# Patient Record
Sex: Male | Born: 1985 | Race: White | Hispanic: No | Marital: Single | Smoking: Former smoker
Health system: Southern US, Community
[De-identification: ages and names within clinical notes are randomized; demographics above are authoritative.]

## PROBLEM LIST (undated history)

## (undated) DIAGNOSIS — Z8659 Personal history of other mental and behavioral disorders: Secondary | ICD-10-CM

## (undated) HISTORY — PX: EYE SURGERY: SHX253

---
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
## 2007-12-11 ENCOUNTER — Emergency Department (HOSPITAL_COMMUNITY): Admission: EM | Admit: 2007-12-11 | Discharge: 2007-12-11 | Payer: Self-pay | Admitting: Emergency Medicine

## 2009-07-05 ENCOUNTER — Encounter (INDEPENDENT_AMBULATORY_CARE_PROVIDER_SITE_OTHER): Payer: Self-pay | Admitting: *Deleted

## 2012-02-26 ENCOUNTER — Ambulatory Visit (INDEPENDENT_AMBULATORY_CARE_PROVIDER_SITE_OTHER): Payer: 59 | Admitting: Physician Assistant

## 2012-02-26 VITALS — BP 124/76 | HR 94 | Temp 98.4°F | Resp 18 | Ht 68.0 in | Wt 142.0 lb

## 2012-02-26 DIAGNOSIS — L723 Sebaceous cyst: Secondary | ICD-10-CM

## 2012-02-26 NOTE — Progress Notes (Signed)
  Subjective:    Patient ID: Timothy Whitaker, male    DOB: 13-Apr-1986, 26 y.o.   MRN: [id number]  HPI 26 year old male presents with 6 year history of bumps on scrotum. He has been seen here multiple times in the past for evaluation, and has also been referred to dermatology. He never went to see Derm and never took any of the medications that were prescribed by [place] (doxycycline).  Says these lesions first appeared in high school, "years" before he became sexually active.  Denies any pain or tenderness, penile discharge, dysuria, fevers, or chills. No concern about STI's today. He wants a referral to Dermatology for further evaluation and treatment.     Review of Systems  All other systems reviewed and are negative.       Objective:   Physical Exam  Constitutional: He is oriented to person, place, and time. He appears well-developed and well-nourished.  HENT:  Head: Normocephalic and atraumatic.  Right Ear: External ear normal.  Left Ear: External ear normal.  Eyes: Conjunctivae are normal.  Neck: Normal range of motion.  Cardiovascular: Normal rate, regular rhythm and normal heart sounds.   Pulmonary/Chest: Effort normal and breath sounds normal.  Genitourinary: Testes normal and penis normal.       Scrotum has multiple sebaceous cysts of varying size. Several have open pore with white material that can be expressed.   Lymphadenopathy:       Right: No inguinal adenopathy present.       Left: No inguinal adenopathy present.  Neurological: He is alert and oriented to person, place, and time.  Psychiatric: He has a normal mood and affect. His behavior is normal. Judgment and thought content normal.          Assessment & Plan:   1. Sebaceous cyst  Multiple sebaceous cysts, non inflamed today.  Will refer to Dermatology for further evaluation and treatment.  Ambulatory referral to Dermatology

## 2013-09-13 ENCOUNTER — Ambulatory Visit: Payer: 59 | Admitting: Emergency Medicine

## 2013-09-13 VITALS — BP 122/68 | HR 99 | Temp 97.8°F | Resp 16 | Ht 68.5 in | Wt 136.0 lb

## 2013-09-13 DIAGNOSIS — J029 Acute pharyngitis, unspecified: Secondary | ICD-10-CM

## 2013-09-13 LAB — POCT RAPID STREP A (OFFICE): Rapid Strep A Screen: NEGATIVE

## 2013-09-13 MED ORDER — FIRST-DUKES MOUTHWASH MT SUSP: OROMUCOSAL | Status: DC

## 2013-09-13 NOTE — Progress Notes (Addendum)
   Subjective:    Patient ID: Timothy Whitaker, male    DOB: 02/11/1986, 28 y.o.   MRN: [id number]  HPI This chart was scribed for Lesle ChrisSteven Daub, MD by Andrew Auaven Small, ED Scribe. This patient was seen in room 5 and the patient's care was started at 10:07 AM.  HPI Comments: Timothy Whitaker is a 28 y.o. male who presents to the [facility] complaining of a constant sore throat onset 3-4 days. He reports it hurts to swallow and that his symptoms are not getting better. He reports a post nasal drip. Pt reports that has been having difficulty sleeping due to sore throat. He denies being around people with strep throat. 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 He denies Ha, fever, and cough. He denies feeling bad. Pt denies any other medical problem. Pt states that he has strep throat in the past but does not feel as bad.   History reviewed. No pertinent past medical history. No Known Allergies Prior to Admission medications   Not on File   Review of Systems  Constitutional: Negative for fever and chills.  HENT: Positive for postnasal drip, sore throat and trouble swallowing. Negative for congestion and sinus pressure.   Neurological: Negative for headaches.      Objective:   Physical Exam  Nursing note and vitals reviewed. Constitutional: He is oriented to person, place, and time. He appears well-developed and well-nourished. No distress.  HENT:  Head: Normocephalic and atraumatic.  Eyes: Conjunctivae and EOM are normal.  Neck: Neck supple. No tracheal deviation present.  Cardiovascular: Normal rate.   Pulmonary/Chest: Effort normal. No respiratory distress.  Musculoskeletal: Normal range of motion.  Neurological: He is alert and oriented to person, place, and time.  Skin: Skin is warm and dry.  Psychiatric: He has a normal mood and affect. His behavior is normal.   tonsils are 2+. There is mild exudate on the superior left tonsil. There is minimal  cervical adenopathy Results for orders placed in visit on 09/13/13  POCT RAPID STREP A (OFFICE)      Result Value Range   Rapid Strep A Screen Negative  Negative    Filed Vitals:   09/13/13 0927  BP: 122/68  Pulse: 99  Temp: 97.8 F (36.6 C)  Resp: 16      Assessment & Plan:  We'll treat symptomatically with saltwater gargle and Dukes mouthwash I personally performed the services described in this documentation, which was scribed in my presence. The recorded information has been reviewed and is accurate.

## 2013-09-13 NOTE — Patient Instructions (Signed)
Sore Throat A sore throat is pain, burning, irritation, or scratchiness of the throat. There is often pain or tenderness when swallowing or talking. A sore throat may be accompanied by other symptoms, such as coughing, sneezing, fever, and swollen neck glands. A sore throat is often the first sign of another sickness, such as a cold, flu, strep throat, or mononucleosis (commonly known as mono). Most sore throats go away without medical treatment. CAUSES  The most common causes of a sore throat include:  A viral infection, such as a cold, flu, or mono.  A bacterial infection, such as strep throat, tonsillitis, or whooping cough.  Seasonal allergies.  Dryness in the air.  Irritants, such as smoke or pollution.  Gastroesophageal reflux disease (GERD). HOME CARE INSTRUCTIONS   Only take over-the-counter medicines as directed by your caregiver.  Drink enough fluids to keep your urine clear or pale yellow.  Rest as needed.  Try using throat sprays, lozenges, or sucking on hard candy to ease any pain (if older than 4 years or as directed).  Sip warm liquids, such as broth, herbal tea, or warm water with honey to relieve pain temporarily. You may also eat or drink cold or frozen liquids such as frozen ice pops.  Gargle with salt water (mix 1 tsp salt with 8 oz of water).  Do not smoke and avoid secondhand smoke.  Put a cool-mist humidifier in your bedroom at night to moisten the air. You can also turn on a hot shower and sit in the bathroom with the door closed for 5 10 minutes. SEEK IMMEDIATE MEDICAL CARE IF:  You have difficulty breathing.  You are unable to swallow fluids, soft foods, or your saliva.  You have increased swelling in the throat.  Your sore throat does not get better in 7 days.  You have nausea and vomiting.  You have a fever or persistent symptoms for more than 2 3 days.  You have a fever and your symptoms suddenly get worse. MAKE SURE YOU:   Understand  these instructions.  Will watch your condition.  Will get help right away if you are not doing well or get worse. Document Released: 09/06/2004 Document Revised: 07/16/2012 Document Reviewed: 04/06/2012 [employer] Patient Information 2014 [employer].  

## 2013-09-16 ENCOUNTER — Telehealth: Payer: Self-pay

## 2013-09-16 LAB — CULTURE, GROUP A STREP

## 2013-09-16 MED ORDER — AMOXICILLIN 875 MG PO TABS: 875.0000 mg | ORAL_TABLET | Freq: Two times a day (BID) | ORAL | Status: DC

## 2013-09-16 NOTE — Telephone Encounter (Signed)
Called pt per Dr Ellis Parentsaub's req and advised culture pos for Strep not group A and sent in Abx. Pt reported that his sore throat has improved and hasn't really noticed it hurting today. Advised pt to not p/up Rx before hearing back from us unless he worsens again. Dr Cleta Albertsaub, do you want pt to take Abx since Sxs have resolved?

## 2013-09-17 NOTE — Telephone Encounter (Signed)
No anw/No VM set up. Will try again later.

## 2013-09-17 NOTE — Telephone Encounter (Signed)
Called patient and let him know that it is not essential that he take the antibiotics because it is not group a strep. If he is fine now and has no sore throat or fever he does not have to take the antibiotic.

## 2013-09-20 NOTE — Telephone Encounter (Signed)
Unable to reach . VM not set up

## 2013-09-21 ENCOUNTER — Encounter: Payer: Self-pay | Admitting: Radiology

## 2013-09-21 NOTE — Telephone Encounter (Signed)
UTR letter sent. 

## 2013-11-01 ENCOUNTER — Ambulatory Visit: Payer: 59 | Admitting: Family Medicine

## 2013-11-01 VITALS — BP 120/80 | HR 115 | Temp 97.3°F | Resp 20 | Ht 68.0 in | Wt 138.0 lb

## 2013-11-01 DIAGNOSIS — R112 Nausea with vomiting, unspecified: Secondary | ICD-10-CM

## 2013-11-01 LAB — POCT CBC
Granulocyte percent: 83.9 %G — AB (ref 37–80)
HCT, POC: 50.8 % (ref 43.5–53.7)
Hemoglobin: 16.4 g/dL (ref 14.1–18.1)
Lymph, poc: 1.6 (ref 0.6–3.4)
MCH, POC: 30.8 pg (ref 27–31.2)
MCHC: 32.3 g/dL (ref 31.8–35.4)
MCV: 95.3 fL (ref 80–97)
MID (cbc): 0.5 (ref 0–0.9)
MPV: 11.4 fL (ref 0–99.8)
POC Granulocyte: 10.7 — AB (ref 2–6.9)
POC LYMPH PERCENT: 12.5 %L (ref 10–50)
POC MID %: 3.6 %M (ref 0–12)
Platelet Count, POC: 254 10*3/uL (ref 142–424)
RBC: 5.33 M/uL (ref 4.69–6.13)
RDW, POC: 14.7 %
WBC: 12.8 10*3/uL — AB (ref 4.6–10.2)

## 2013-11-01 MED ORDER — ONDANSETRON 8 MG PO TBDP: 8.0000 mg | ORAL_TABLET | Freq: Three times a day (TID) | ORAL | Status: DC | PRN

## 2013-11-01 MED ORDER — ONDANSETRON HCL 4 MG/2ML IJ SOLN
8.0000 mg | Freq: Once | INTRAMUSCULAR | Status: AC
  Administered 2013-11-01: 8 mg via INTRAVENOUS

## 2013-11-01 NOTE — Patient Instructions (Signed)

## 2013-11-01 NOTE — Progress Notes (Signed)
This chart was scribed for Elvina SidleKurt Lauenstein, MD by Quintella ReichertMatthew Underwood, ED scribe.  This patient [id number] and the patient's care was started at 10:36 AM.  @[facility] @  Patient ID: Timothy Whitaker MRN: [id number], DOB: [birth year], 28 y.o. Date of Encounter: 11/01/2013, 10:36 AM  Primary Physician: No primary provider on file.  Chief Complaint  Patient presents with   Nausea    4 am   Emesis     HPI: 28 y.o. year old male with history below presents with vomiting that began on waking this morning.  Pt states he was slightly nauseated last night and did not eat dinner.  This morning he awoke very nauseated at 4:30 AM and he has been vomiting continuously since then.  He also reports abdominal soreness and dizziness.  He denies diarrhea, fever, sore throat, cough, congestion, headache, or any other associated symptoms.    Pt denies prior h/o GI issues.  He denies recent sick contacts or suspect food intake.  He drank some alcohol last night but does not think he drank enough to cause his symptoms.  He denies h/o asthma or any other medical conditions.  He works in [employer] but has not been working for the past few days.   No past medical history on file.   Home Meds: Prior to Admission medications   Medication Sig Start Date End Date Taking? Authorizing Provider  amoxicillin (AMOXIL) 875 MG tablet Take 1 tablet (875 mg total) by mouth 2 (two) times daily. 09/16/13  Yes Collene GobbleSteven A Daub, MD  Diphenhyd-Hydrocort-Nystatin (FIRST-DUKES MOUTHWASH) SUSP 1 teaspoon as rinse, gargle, and spit 4 times a day 09/13/13  Yes Collene GobbleSteven A Daub, MD    Allergies: No Known Allergies  History   Social History   Marital Status: Single    Spouse Name: N/A    Number of Children: N/A   Years of Education: N/A   Occupational History   Not on file.   Social History Main Topics   Smoking status: Former Smoker   Smokeless tobacco: Not on file   Alcohol Use: Not on file   Drug Use: Not on  file   Sexual Activity: Not on file   Other Topics Concern   Not on file   Social History Narrative   No narrative on file     Review of Systems: Constitutional: negative for chills, fever, night sweats, weight changes, or fatigue  HEENT: negative for vision changes, hearing loss, congestion, rhinorrhea, ST, epistaxis, or sinus pressure Cardiovascular: negative for chest pain or palpitations Respiratory: negative for hemoptysis, wheezing, shortness of breath, or cough Abdominal: positive for abdominal pain, nausea, and vomiting. negative for diarrhea or constipation Dermatological: negative for rash Neurologic: positive for dizziness. negative for headache or syncope All other systems reviewed and are otherwise negative with the exception to those above and in the HPI.   Physical Exam: Blood pressure 120/80, pulse 115, temperature 97.3 F (36.3 C), temperature source Oral, resp. rate 20, height 5\' 8"  (1.727 m), weight 138 lb (62.596 kg), SpO2 100.00%., Body mass index is 20.99 kg/(m^2). General: Well developed, well nourished, in no acute distress. Head: Normocephalic, atraumatic, eyes without discharge, sclera non-icteric, nares are without discharge. Bilateral auditory canals clear, TM's are without perforation, pearly grey and translucent with reflective cone of light bilaterally. Oral cavity moist, posterior pharynx without exudate, erythema, peritonsillar abscess, or post nasal drip.  Neck: Supple. No thyromegaly. Full ROM. No lymphadenopathy. Lungs: Clear bilaterally to auscultation without  wheezes, rales, or rhonchi. Breathing is unlabored. Heart: RRR with S1 S2. No murmurs, rubs, or gallops appreciated. Abdomen: Soft, non-tender, non-distended with normoactive bowel sounds. No hepatomegaly. No rebound/guarding. No obvious abdominal masses.  No HSM. Msk:  Strength and tone normal for age. Extremities/Skin: Warm and dry. No clubbing or cyanosis. No edema. No rashes or  suspicious lesions. Neuro: Alert and oriented X 3. Moves all extremities spontaneously. Gait is normal. CNII-XII grossly in tact. Psych:  Responds to questions appropriately with a normal affect.   Labs:  Results for orders placed in visit on 09/13/13  CULTURE, GROUP A STREP      Result Value Ref Range   Organism ID, Bacteria STREPTOCOCCUS BETA HEMOLYTIC NOT GROUP A    POCT RAPID STREP A (OFFICE)      Result Value Ref Range   Rapid Strep A Screen Negative  Negative   Results for orders placed in visit on 11/01/13  POCT CBC      Result Value Ref Range   WBC 12.8 (*) 4.6 - 10.2 K/uL   Lymph, poc 1.6  0.6 - 3.4   POC LYMPH PERCENT 12.5  10 - 50 %L   MID (cbc) 0.5  0 - 0.9   POC MID % 3.6  0 - 12 %M   POC Granulocyte 10.7 (*) 2 - 6.9   Granulocyte percent 83.9 (*) 37 - 80 %G   RBC 5.33  4.69 - 6.13 M/uL   Hemoglobin 16.4  14.1 - 18.1 g/dL   HCT, POC 16.1  09.6 - 53.7 %   MCV 95.3  80 - 97 fL   MCH, POC 30.8  27 - 31.2 pg   MCHC 32.3  31.8 - 35.4 g/dL   RDW, POC 04.5     Platelet Count, POC 254  142 - 424 K/uL   MPV 11.4  0 - 99.8 fL     ASSESSMENT AND PLAN:  28 y.o. year old male with acute nausea, vomiting and dehydration  Nausea and vomiting - Plan: ondansetron (ZOFRAN) injection 8 mg, POCT CBC, ondansetron (ZOFRAN ODT) 8 MG disintegrating tablet    Signed, Elvina Sidle, MD 11/01/2013 10:36 AM

## 2014-01-13 ENCOUNTER — Ambulatory Visit: Payer: Self-pay

## 2014-01-13 ENCOUNTER — Ambulatory Visit (INDEPENDENT_AMBULATORY_CARE_PROVIDER_SITE_OTHER): Payer: 59 | Admitting: Family Medicine

## 2014-01-13 ENCOUNTER — Ambulatory Visit: Payer: 59

## 2014-01-13 DIAGNOSIS — S0990XA Unspecified injury of head, initial encounter: Secondary | ICD-10-CM

## 2014-01-13 DIAGNOSIS — S42009A Fracture of unspecified part of unspecified clavicle, initial encounter for closed fracture: Secondary | ICD-10-CM

## 2014-01-13 MED ORDER — KETOROLAC TROMETHAMINE 60 MG/2ML IM SOLN
60.0000 mg | Freq: Once | INTRAMUSCULAR | Status: AC
  Administered 2014-01-13: 60 mg via INTRAMUSCULAR
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
## 2014-01-13 NOTE — Patient Instructions (Signed)
Thank you for coming in today  You broke your collarbone Stay in sling until followup We will refer you to orthopedics for evaluation of clavicle fracture Please return tomorrow for re-evaluation  For pain tonight, Take either 800 mg ibuprofen (4 tablets) every 8 hrs or Aleve 2 tablets 2x per day Also may take tylenol 1000 mg (2 extra strength tablets) up to 3 x per day When you come back tomorrow we can reassess and consider pain medication once the alcohol has worn off  Clavicle Fracture A clavicle fracture is a break in the collarbone. This is a common injury, especially in children. Collarbones do not harden until around the age of 66. Most collarbone fractures are treated with a simple arm sling. In some cases a figure-of-eight splint is used to help hold the broken bones in position. Although not often needed, surgery may be required if the bone fragments are not in the correct position (displaced).  HOME CARE INSTRUCTIONS   Apply ice to the injury for 15-20 minutes each hour while awake for 2 days. Put the ice in a plastic bag and place a towel between the bag of ice and your skin.  Wear the sling or splint constantly for as long as directed by your caregiver. You may remove the sling or splint for bathing or showering. Be sure to keep your shoulder in the same place as when the sling or splint is on. Do not lift your arm.  If a figure-of-eight splint is applied, it must be tightened by another person every day. Tighten it enough to keep the shoulders held back. Allow enough room to place the index finger between the body and strap. Loosen the splint immediately if you feel numbness or tingling in your hands.  Only take over-the-counter or prescription medicines for pain, discomfort, or fever as directed by your caregiver.  Avoid activities that irritate or increase the pain for 4 to 6 weeks after surgery.  Follow all instructions for follow-up with your caregiver. This includes any  referrals, physical therapy, and rehabilitation. Any delay in obtaining necessary care could result in a delay or failure of the injury to heal properly. SEEK MEDICAL CARE IF:  You have pain and swelling that are not relieved with medications. SEEK IMMEDIATE MEDICAL CARE IF:  Your arm is numb, cold, or pale, even when the splint is loose. MAKE SURE YOU:   Understand these instructions.  Will watch your condition.  Will get help right away if you are not doing well or get worse. Document Released: 05/09/2005 Document Revised: 10/22/2011 Document Reviewed: 03/04/2008 [facility] Patient Information 2014 [place], [place].

## 2014-02-03 ENCOUNTER — Encounter (HOSPITAL_BASED_OUTPATIENT_CLINIC_OR_DEPARTMENT_OTHER): Payer: Self-pay | Admitting: *Deleted

## 2014-02-03 ENCOUNTER — Other Ambulatory Visit: Payer: Self-pay | Admitting: Orthopedic Surgery

## 2014-02-05 ENCOUNTER — Encounter (HOSPITAL_BASED_OUTPATIENT_CLINIC_OR_DEPARTMENT_OTHER)
Admission: RE | Disposition: A | Discharge status: Home / Self Care | Payer: Self-pay | Source: Ambulatory Visit | Attending: Orthopedic Surgery

## 2014-02-05 ENCOUNTER — Encounter (HOSPITAL_BASED_OUTPATIENT_CLINIC_OR_DEPARTMENT_OTHER): Payer: Self-pay

## 2014-02-05 ENCOUNTER — Encounter (HOSPITAL_BASED_OUTPATIENT_CLINIC_OR_DEPARTMENT_OTHER): Payer: Self-pay | Admitting: Anesthesiology

## 2014-02-05 ENCOUNTER — Ambulatory Visit (HOSPITAL_BASED_OUTPATIENT_CLINIC_OR_DEPARTMENT_OTHER)
Admission: RE | Admit: 2014-02-05 | Discharge: 2014-02-05 | Disposition: A | Discharge status: Home / Self Care | Payer: Self-pay | Source: Ambulatory Visit | Attending: Orthopedic Surgery | Admitting: Orthopedic Surgery

## 2014-02-05 ENCOUNTER — Ambulatory Visit (HOSPITAL_BASED_OUTPATIENT_CLINIC_OR_DEPARTMENT_OTHER): Payer: Self-pay | Admitting: Anesthesiology

## 2014-02-05 DIAGNOSIS — S42023A Displaced fracture of shaft of unspecified clavicle, initial encounter for closed fracture: Secondary | ICD-10-CM | POA: Insufficient documentation

## 2014-02-05 DIAGNOSIS — F909 Attention-deficit hyperactivity disorder, unspecified type: Secondary | ICD-10-CM | POA: Insufficient documentation

## 2014-02-05 DIAGNOSIS — Z87891 Personal history of nicotine dependence: Secondary | ICD-10-CM | POA: Insufficient documentation

## 2014-02-05 DIAGNOSIS — X58XXXA Exposure to other specified factors, initial encounter: Secondary | ICD-10-CM | POA: Insufficient documentation

## 2014-02-05 HISTORY — DX: Personal history of other mental and behavioral disorders: Z86.59

## 2014-02-05 HISTORY — PX: ORIF CLAVICULAR FRACTURE: SHX5055

## 2014-02-05 SURGERY — OPEN REDUCTION INTERNAL FIXATION (ORIF) CLAVICULAR FRACTURE
Anesthesia: General | Laterality: Left

## 2014-02-05 MED ORDER — ONDANSETRON HCL 4 MG/2ML IJ SOLN: 4.0000 mg | Freq: Four times a day (QID) | INTRAMUSCULAR | Status: DC | PRN

## 2014-02-05 MED ORDER — OXYCODONE HCL 5 MG PO TABS
ORAL_TABLET | ORAL | Status: AC
  Filled 2014-02-05: qty 1

## 2014-02-05 MED ORDER — CEFAZOLIN SODIUM 1-5 GM-% IV SOLN
INTRAVENOUS | Status: AC
  Filled 2014-02-05: qty 50

## 2014-02-05 MED ORDER — CEFAZOLIN SODIUM-DEXTROSE 2-3 GM-% IV SOLR
INTRAVENOUS | Status: AC
  Filled 2014-02-05: qty 50

## 2014-02-05 MED ORDER — KETOROLAC TROMETHAMINE 30 MG/ML IJ SOLN
INTRAMUSCULAR | Status: DC | PRN
  Administered 2014-02-05: 30 mg via INTRAVENOUS

## 2014-02-05 MED ORDER — SUCCINYLCHOLINE CHLORIDE 20 MG/ML IJ SOLN
INTRAMUSCULAR | Status: DC | PRN
  Administered 2014-02-05: 100 mg via INTRAVENOUS

## 2014-02-05 MED ORDER — MIDAZOLAM HCL 2 MG/2ML IJ SOLN
INTRAMUSCULAR | Status: AC
  Filled 2014-02-05: qty 2

## 2014-02-05 MED ORDER — HYDROMORPHONE HCL 2 MG PO TABS: 2.0000 mg | ORAL_TABLET | ORAL | Status: AC | PRN

## 2014-02-05 MED ORDER — ONDANSETRON HCL 4 MG/2ML IJ SOLN
INTRAMUSCULAR | Status: DC | PRN
  Administered 2014-02-05: 4 mg via INTRAVENOUS

## 2014-02-05 MED ORDER — CEFAZOLIN SODIUM-DEXTROSE 2-3 GM-% IV SOLR
2.0000 g | INTRAVENOUS | Status: AC
  Administered 2014-02-05: 2 g via INTRAVENOUS

## 2014-02-05 MED ORDER — SODIUM CHLORIDE 0.9 % IJ SOLN
INTRAMUSCULAR | Status: AC
  Filled 2014-02-05: qty 10

## 2014-02-05 MED ORDER — LACTATED RINGERS IV SOLN
INTRAVENOUS | Status: DC
  Administered 2014-02-05 (×2): via INTRAVENOUS

## 2014-02-05 MED ORDER — LIDOCAINE HCL (CARDIAC) 20 MG/ML IV SOLN
INTRAVENOUS | Status: DC | PRN
  Administered 2014-02-05: 75 mg via INTRAVENOUS

## 2014-02-05 MED ORDER — FENTANYL CITRATE 0.05 MG/ML IJ SOLN
INTRAMUSCULAR | Status: DC | PRN
  Administered 2014-02-05 (×3): 100 ug via INTRAVENOUS

## 2014-02-05 MED ORDER — PROPOFOL 10 MG/ML IV BOLUS
INTRAVENOUS | Status: DC | PRN
  Administered 2014-02-05: 200 mg via INTRAVENOUS

## 2014-02-05 MED ORDER — MIDAZOLAM HCL 5 MG/5ML IJ SOLN
INTRAMUSCULAR | Status: DC | PRN
  Administered 2014-02-05: 4 mg via INTRAVENOUS

## 2014-02-05 MED ORDER — FENTANYL CITRATE 0.05 MG/ML IJ SOLN
INTRAMUSCULAR | Status: AC
  Filled 2014-02-05: qty 6

## 2014-02-05 MED ORDER — PROMETHAZINE HCL 25 MG/ML IJ SOLN
INTRAMUSCULAR | Status: AC
  Filled 2014-02-05: qty 1

## 2014-02-05 MED ORDER — BUPIVACAINE HCL (PF) 0.5 % IJ SOLN
INTRAMUSCULAR | Status: DC | PRN
  Administered 2014-02-05: 20 mL

## 2014-02-05 MED ORDER — HYDROMORPHONE HCL PF 1 MG/ML IJ SOLN
INTRAMUSCULAR | Status: AC
  Filled 2014-02-05: qty 1

## 2014-02-05 MED ORDER — OXYCODONE HCL 5 MG/5ML PO SOLN: 5.0000 mg | Freq: Once | ORAL | Status: AC | PRN

## 2014-02-05 MED ORDER — OXYCODONE HCL 5 MG PO TABS
5.0000 mg | ORAL_TABLET | Freq: Once | ORAL | Status: AC | PRN
  Administered 2014-02-05: 5 mg via ORAL

## 2014-02-05 MED ORDER — FENTANYL CITRATE 0.05 MG/ML IJ SOLN: 50.0000 ug | INTRAMUSCULAR | Status: DC | PRN

## 2014-02-05 MED ORDER — PROMETHAZINE HCL 25 MG/ML IJ SOLN
6.2500 mg | INTRAMUSCULAR | Status: DC | PRN
  Administered 2014-02-05: 6.25 mg via INTRAVENOUS

## 2014-02-05 MED ORDER — OXYCODONE-ACETAMINOPHEN 5-325 MG PO TABS: 1.0000 | ORAL_TABLET | Freq: Four times a day (QID) | ORAL | Status: DC | PRN

## 2014-02-05 MED ORDER — HYDROMORPHONE HCL PF 1 MG/ML IJ SOLN
0.2500 mg | INTRAMUSCULAR | Status: DC | PRN
  Administered 2014-02-05 (×2): 0.5 mg via INTRAVENOUS

## 2014-02-05 MED ORDER — DEXAMETHASONE SODIUM PHOSPHATE 4 MG/ML IJ SOLN
INTRAMUSCULAR | Status: DC | PRN
  Administered 2014-02-05: 10 mg via INTRAVENOUS

## 2014-02-05 MED ORDER — CEFAZOLIN SODIUM 1-5 GM-% IV SOLN
1.0000 g | Freq: Once | INTRAVENOUS | Status: AC
  Administered 2014-02-05: 1 g via INTRAVENOUS

## 2014-02-05 MED ORDER — OXYCODONE-ACETAMINOPHEN 5-325 MG PO TABS: 1.0000 | ORAL_TABLET | Freq: Four times a day (QID) | ORAL | Status: AC | PRN

## 2014-02-05 MED ORDER — MIDAZOLAM HCL 2 MG/2ML IJ SOLN: 1.0000 mg | INTRAMUSCULAR | Status: DC | PRN

## 2014-02-05 MED ORDER — MORPHINE SULFATE 10 MG/ML IJ SOLN
INTRAMUSCULAR | Status: AC
  Filled 2014-02-05: qty 1

## 2014-02-05 MED ORDER — POVIDONE-IODINE 7.5 % EX SOLN: Freq: Once | CUTANEOUS | Status: DC

## 2014-02-05 MED ORDER — LACTATED RINGERS IV SOLN
INTRAVENOUS | Status: DC
  Administered 2014-02-05: 16:00:00 via INTRAVENOUS

## 2014-02-05 MED ORDER — MORPHINE SULFATE 10 MG/ML IJ SOLN
INTRAMUSCULAR | Status: DC | PRN
  Administered 2014-02-05 (×2): 5 mg via INTRAVENOUS

## 2014-02-05 SURGICAL SUPPLY — 60 items
BENZOIN TINCTURE PRP APPL 2/3 (GAUZE/BANDAGES/DRESSINGS) ×3 IMPLANT
BIT DRILL 2.8X5 QR DISP (BIT) ×3 IMPLANT
BLADE SURG 15 STRL LF DISP TIS (BLADE) ×2 IMPLANT
BLADE SURG 15 STRL SS (BLADE) ×4
CANISTER SUCT 1200ML W/VALVE (MISCELLANEOUS) ×3 IMPLANT
CLEANER CAUTERY TIP 5X5 PAD (MISCELLANEOUS) ×1 IMPLANT
CLOSURE WOUND 1/2 X4 (GAUZE/BANDAGES/DRESSINGS) ×1
DECANTER SPIKE VIAL GLASS SM (MISCELLANEOUS) IMPLANT
DRAPE C-ARM 42X72 X-RAY (DRAPES) ×3 IMPLANT
DRAPE INCISE IOBAN 66X45 STRL (DRAPES) ×3 IMPLANT
DRAPE OEC MINIVIEW 54X84 (DRAPES) ×3 IMPLANT
DRAPE SURG 17X23 STRL (DRAPES) ×3 IMPLANT
DRAPE U-SHAPE 47X51 STRL (DRAPES) ×3 IMPLANT
DRAPE U-SHAPE 76X120 STRL (DRAPES) ×6 IMPLANT
DRSG EMULSION OIL 3X3 NADH (GAUZE/BANDAGES/DRESSINGS) ×3 IMPLANT
DURAPREP 26ML APPLICATOR (WOUND CARE) ×3 IMPLANT
ELECT REM PT RETURN 9FT ADLT (ELECTROSURGICAL) ×3
ELECTRODE REM PT RTRN 9FT ADLT (ELECTROSURGICAL) ×1 IMPLANT
GAUZE SPONGE 4X4 12PLY STRL (GAUZE/BANDAGES/DRESSINGS) ×3 IMPLANT
GLOVE BIO SURGEON STRL SZ7 (GLOVE) ×3 IMPLANT
GLOVE BIOGEL PI IND STRL 8 (GLOVE) ×2 IMPLANT
GLOVE BIOGEL PI INDICATOR 8 (GLOVE) ×4
GLOVE ECLIPSE 7.5 STRL STRAW (GLOVE) ×6 IMPLANT
GOWN STRL REUS W/ TWL LRG LVL3 (GOWN DISPOSABLE) ×3 IMPLANT
GOWN STRL REUS W/TWL LRG LVL3 (GOWN DISPOSABLE) ×6
GOWN STRL REUS W/TWL XL LVL3 (GOWN DISPOSABLE) ×3 IMPLANT
NS IRRIG 1000ML POUR BTL (IV SOLUTION) ×3 IMPLANT
PACK ARTHROSCOPY DSU (CUSTOM PROCEDURE TRAY) ×3 IMPLANT
PACK BASIN DAY SURGERY FS (CUSTOM PROCEDURE TRAY) ×3 IMPLANT
PAD CLEANER CAUTERY TIP 5X5 (MISCELLANEOUS) ×2
PENCIL BUTTON HOLSTER BLD 10FT (ELECTRODE) ×3 IMPLANT
PLATE CLAVICLE 8 HOLE LEFT (Plate) ×3 IMPLANT
SCREW HEXALOBE LOCKING 3.5X14M (Screw) ×3 IMPLANT
SCREW HEXALOBE NON-LOCK 3.5X14 (Screw) ×3 IMPLANT
SCREW LOCK 12X3.5X HEXALOBE (Screw) ×3 IMPLANT
SCREW LOCKING 3.5X12 (Screw) ×6 IMPLANT
SCREW NON LOCK 3.5X8MM (Screw) ×3 IMPLANT
SCREW NONLOCK HEX 3.5X12 (Screw) ×3 IMPLANT
SLEEVE SCD COMPRESS KNEE MED (MISCELLANEOUS) ×3 IMPLANT
SLING ARM IMMOBILIZER LRG (SOFTGOODS) IMPLANT
SLING ARM IMMOBILIZER MED (SOFTGOODS) IMPLANT
SLING ARM LRG ADULT FOAM STRAP (SOFTGOODS) IMPLANT
SLING ARM MED ADULT FOAM STRAP (SOFTGOODS) ×3 IMPLANT
SLING ARM XL FOAM STRAP (SOFTGOODS) IMPLANT
SPONGE LAP 4X18 X RAY DECT (DISPOSABLE) ×3 IMPLANT
STRIP CLOSURE SKIN 1/2X4 (GAUZE/BANDAGES/DRESSINGS) ×2 IMPLANT
SUCTION FRAZIER TIP 10 FR DISP (SUCTIONS) ×3 IMPLANT
SUT FIBERWIRE #2 38 T-5 BLUE (SUTURE) ×3
SUT MNCRL AB 3-0 PS2 18 (SUTURE) ×3 IMPLANT
SUT VIC AB 0 CT1 27 (SUTURE) ×3
SUT VIC AB 0 CT1 27XBRD ANBCTR (SUTURE) ×1 IMPLANT
SUT VIC AB 1 CT1 27 (SUTURE)
SUT VIC AB 1 CT1 27XBRD ANBCTR (SUTURE) IMPLANT
SUT VIC AB 2-0 SH 27 (SUTURE) ×2
SUT VIC AB 2-0 SH 27XBRD (SUTURE) ×1 IMPLANT
SUTURE FIBERWR #2 38 T-5 BLUE (SUTURE) ×1 IMPLANT
SYR BULB 3OZ (MISCELLANEOUS) ×3 IMPLANT
TOWEL OR 17X24 6PK STRL BLUE (TOWEL DISPOSABLE) ×3 IMPLANT
TOWEL OR NON WOVEN STRL DISP B (DISPOSABLE) ×3 IMPLANT
YANKAUER SUCT BULB TIP NO VENT (SUCTIONS) ×3 IMPLANT

## 2014-02-05 NOTE — Transfer of Care (Signed)
Immediate Anesthesia Transfer of Care Note  Patient: Timothy Whitaker  Procedure(s) Performed: Procedure(s): OPEN REDUCTION INTERNAL FIXATION (ORIF) CLAVICULAR FRACTURE (Left)  Patient Location: PACU  Anesthesia Type:General  Level of Consciousness: awake, alert  and oriented  Airway & Oxygen Therapy: Patient Spontanous Breathing and Patient connected to face mask oxygen  Post-op Assessment: Report given to PACU RN and Post -op Vital signs reviewed and stable  Post vital signs: Reviewed and stable  Complications: No apparent anesthesia complications

## 2014-02-05 NOTE — Brief Op Note (Signed)
02/05/2014  2:16 PM  PATIENT:  Timothy Whitaker  28 y.o. male  PRE-OPERATIVE DIAGNOSIS:  communuted and displaced clavicle fracture Left  POST-OPERATIVE DIAGNOSIS:  communuted and displaced claviclefracture Left  PROCEDURE:  Procedure(s): OPEN REDUCTION INTERNAL FIXATION (ORIF) CLAVICULAR FRACTURE (Left)  SURGEON:  Surgeon(s) and Role:    * Harvie JuniorJohn L Graves, MD - Primary  PHYSICIAN ASSISTANT:   ASSISTANTS: bethune   ANESTHESIA:   general  EBL:  Total I/O In: 1000 [I.V.:1000] Out: -   BLOOD ADMINISTERED:none  DRAINS: none   LOCAL MEDICATIONS USED:  MARCAINE     SPECIMEN:  No Specimen  DISPOSITION OF SPECIMEN:  N/A  COUNTS:  YES  TOURNIQUET:  * No tourniquets in log *  DICTATION: .Other Dictation: Dictation Number [id number]  PLAN OF CARE: Discharge to home after PACU  PATIENT DISPOSITION:  PACU - hemodynamically stable.   Delay start of Pharmacological VTE agent (>24hrs) due to surgical blood loss or risk of bleeding: no

## 2014-02-05 NOTE — H&P (Signed)
PREOPERATIVE H&P  Chief Complaint: l clavicle pain  HPI: Timothy Whitaker is a 28 y.o. male who presents for evaluation of l clavicle pain. It has been present for 1 month and has been worsening. He has failed conservative measures. Pain is rated as moderate.  Past Medical History  Diagnosis Date  . History of ADHD    Past Surgical History  Procedure Laterality Date  . Eye surgery      age 89   History   Social History  . Marital Status: Single    Spouse Name: N/A    Number of Children: N/A  . Years of Education: N/A   Social History Main Topics  . Smoking status: Former Smoker    Types: Cigarettes    Quit date: 01/14/2014  . Smokeless tobacco: None  . Alcohol Use: Yes     Comment: Drinks weekends only  . Drug Use: Yes    Special: Marijuana     Comment: Smoked 1 month ago  . Sexual Activity: No   Other Topics Concern  . None   Social History Narrative  . None   History reviewed. No pertinent family history. No Known Allergies Prior to Admission medications   Medication Sig Start Date End Date Taking? Authorizing Provider  Multiple Vitamin (MULTIVITAMIN) tablet Take 1 tablet by mouth daily.   Yes Historical Provider, MD     Positive ROS: none  All other systems have been reviewed and were otherwise negative with the exception of those mentioned in the HPI and as above.  Physical Exam: Filed Vitals:   02/05/14 1145  BP: 118/76  Pulse: 90  Temp: 98 F (36.7 C)  Resp: 20    General: Alert, no acute distress Cardiovascular: No pedal edema Respiratory: No cyanosis, no use of accessory musculature GI: No organomegaly, abdomen is soft and non-tender Skin: No lesions in the area of chief complaint Neurologic: Sensation intact distally Psychiatric: Patient is competent for consent with normal mood and affect Lymphatic: No axillary or cervical lymphadenopathy  MUSCULOSKELETAL: l clavicle freely mobile fragments  X-RAY: displaced l clacicle fx with sig  comminution Assessment/Plan: communuted and displaced clavicle fracture Left Plan for Procedure(s): OPEN REDUCTION INTERNAL FIXATION (ORIF) CLAVICULAR FRACTURE  The risks benefits and alternatives were discussed with the patient including but not limited to the risks of nonoperative treatment, versus surgical intervention including infection, bleeding, nerve injury, malunion, nonunion, hardware prominence, hardware failure, need for hardware removal, blood clots, cardiopulmonary complications, morbidity, mortality, among others, and they were willing to proceed.  Predicted outcome is good, although there will be at least a six to nine month expected recovery.  Timothy Whitaker L, MD 02/05/2014 11:57 AM

## 2014-02-05 NOTE — Anesthesia Postprocedure Evaluation (Signed)
  Anesthesia Post-op Note  Patient: Timothy Whitaker  Procedure(s) Performed: Procedure(s): OPEN REDUCTION INTERNAL FIXATION (ORIF) CLAVICULAR FRACTURE (Left)  Patient Location: PACU  Anesthesia Type:General  Level of Consciousness: awake and alert   Airway and Oxygen Therapy: Patient Spontanous Breathing  Post-op Pain: mild  Post-op Assessment: Post-op Vital signs reviewed  Post-op Vital Signs: stable  Last Vitals:  Filed Vitals:   02/05/14 1630  BP: 135/77  Pulse: 85  Temp:   Resp: 14    Complications: No apparent anesthesia complications and mild nausea, Rx

## 2014-02-05 NOTE — Anesthesia Preprocedure Evaluation (Signed)
Anesthesia Evaluation  Patient identified by MRN, date of birth, ID band Patient awake    Reviewed: Allergy & Precautions, H&P , NPO status , Patient's Chart, lab work & pertinent test results  Airway Mallampati: II  Neck ROM: full    Dental   Pulmonary former smoker,          Cardiovascular negative cardio ROS      Neuro/Psych ADHD   GI/Hepatic   Endo/Other    Renal/GU      Musculoskeletal   Abdominal   Peds  Hematology   Anesthesia Other Findings   Reproductive/Obstetrics                           Anesthesia Physical Anesthesia Plan  ASA: I  Anesthesia Plan: General   Post-op Pain Management:    Induction: Intravenous  Airway Management Planned: Oral ETT  Additional Equipment:   Intra-op Plan:   Post-operative Plan:   Informed Consent: I have reviewed the patients History and Physical, chart, labs and discussed the procedure including the risks, benefits and alternatives for the proposed anesthesia with the patient or authorized representative who has indicated his/her understanding and acceptance.     Plan Discussed with: CRNA, Anesthesiologist and Surgeon  Anesthesia Plan Comments:         Anesthesia Quick Evaluation

## 2014-02-05 NOTE — Discharge Instructions (Signed)
Wear sling. Apply ice to left shoulder for 2 or 3 days. You may remove the sling for gentle elbow and wrist range of motion Use pain medication as directed. Do not change the dressing. We will do that in the office.   Post Anesthesia Home Care Instructions  Activity: Get plenty of rest for the remainder of the day. A responsible adult should stay with you for 24 hours following the procedure.  For the next 24 hours, DO NOT: -Drive a car -Advertising copywriterperate machinery -Drink alcoholic beverages -Take any medication unless instructed by your physician -Make any legal decisions or sign important papers.  Meals: Start with liquid foods such as gelatin or soup. Progress to regular foods as tolerated. Avoid greasy, spicy, heavy foods. If nausea and/or vomiting occur, drink only clear liquids until the nausea and/or vomiting subsides. Call your physician if vomiting continues.  Special Instructions/Symptoms: Your throat may feel dry or sore from the anesthesia or the breathing tube placed in your throat during surgery. If this causes discomfort, gargle with warm salt water. The discomfort should disappear within 24 hours.

## 2014-02-05 NOTE — Anesthesia Procedure Notes (Signed)
Procedure Name: Intubation Date/Time: 02/05/2014 12:30 PM Performed by: Zenia ResidesPAYNE, Otillia Cordone D Pre-anesthesia Checklist: Patient identified, Emergency Drugs available, Suction available and Patient being monitored Patient Re-evaluated:Patient Re-evaluated prior to inductionOxygen Delivery Method: Circle System Utilized Preoxygenation: Pre-oxygenation with 100% oxygen Intubation Type: IV induction Ventilation: Mask ventilation without difficulty Laryngoscope Size: Mac and 3 Grade View: Grade I Tube type: Oral Tube size: 7.0 mm Number of attempts: 1 Airway Equipment and Method: stylet and oral airway Placement Confirmation: ETT inserted through vocal cords under direct vision,  positive ETCO2 and breath sounds checked- equal and bilateral Secured at: 22 cm Tube secured with: Tape Dental Injury: Teeth and Oropharynx as per pre-operative assessment

## 2014-02-08 ENCOUNTER — Encounter (HOSPITAL_BASED_OUTPATIENT_CLINIC_OR_DEPARTMENT_OTHER): Payer: Self-pay | Admitting: Orthopedic Surgery

## 2014-02-08 NOTE — Op Note (Signed)
NAMLincoln Brigham:  Timothy Whitaker, Timothy Whitaker                  ACCOUNT NO.:  [id number]  MEDICAL RECORD NO.:  [id number]  LOCATION:                               FACILITY:  [facility]  PHYSICIAN:  Harvie JuniorJohn L. Graves, M.D.   DATE OF BIRTH:  1986/04/24  DATE OF PROCEDURE:  02/05/2014 DATE OF DISCHARGE:  02/05/2014                              OPERATIVE REPORT   PREOPERATIVE DIAGNOSIS:  Comminuted closed distal midshaft clavicle fracture.  POSTOPERATIVE DIAGNOSIS:  Comminuted closed distal midshaft clavicle fracture.  PROCEDURE:  Open reduction internal fixation of midshaft comminuted closed clavicle fracture.  SURGEON:  Harvie JuniorJohn L. Graves, M.D.  ASSISTANT:  Marshia LyJames Bethune, P.A.  ANESTHESIA:  General.  BRIEF HISTORY:  Mr. Timothy Whitaker is a 28-year-male, with a __________ suffered a midshaft comminuted clavicle fracture.  We talked to him initially about treatment options after failure of all conservative care, and essentially tenting of the skin and pain with activity at 3 weeks.  He elected to undergo open reduction internal fixation, we talked about continued conservative care, but he elected for open reduction internal fixation.  He was brought to the operating room for this procedure.  DESCRIPTION OF PROCEDURE:  The patient was brought to the operating room.  After adequate anesthesia was obtained with general anesthetic, the patient was placed supine on the operating table.  The left arm and shoulder were prepped and draped in usual sterile fashion.  Following this, incision was made over the clavicle and subcutaneous __________ was dissected down the level of the clavicle.  The clavicle was then identified and the fractured area was identified.  There was significant amount of callus and early callus formation.  This was debrided until we could just get an idea of where the clavicle wanted to go lengthwise and rotation wise.  Once I had that established, I did not really want to continue to take down the healing that  had happened.  At this point, I elected to discontinue taking down the superior surface of the clavicle. Once we can get that, we can get it mobilized enough to get it to the right length and position.  We put a 8-hole clavicle plate on there. Acumed clavicle plate locked it in with 6 cortices on each side, and this fracture zone got a unicortical and then there was large piece out at the front, which we kind a pieced back in and held in place with a lobster jaw clamp and then put #2 FiberWire.  A tension band was essentially around this.  Once this was done, the wound was irrigated, suctioned dry, closed in layers with 0 Vicryl deep and then 2-0 Vicryl and then 3-0 Monocryl subcuticular. Benzoin and Steri-Strips applied sterile compressive dressing was applied.  The patient was taken to the recovery room, he was noted to be in satisfactory condition.  Estimated blood loss for the procedure was minimal.     Harvie JuniorJohn L. Graves, M.D.     Ranae PlumberJLG/MEDQ  D:  02/05/2014  T:  02/06/2014  Job:  [id number]

## 2014-02-08 NOTE — Addendum Note (Signed)
Addendum created 02/08/14 1410 by Lance CoonWesley Webster, CRNA   Modules edited: Anesthesia Responsible Staff

## 2014-06-08 ENCOUNTER — Telehealth: Payer: Self-pay

## 2014-06-08 NOTE — Telephone Encounter (Signed)
Lm for rtn call- pt must see Dr. Merla Richesoolittle at other clinic. We do not have record of him here.

## 2014-06-08 NOTE — Telephone Encounter (Signed)
Pt of Dr. Merla Richesoolittle needing information from Dr. For concealed to carry permit.

## 2015-11-11 IMAGING — CR DG CERVICAL SPINE COMPLETE 4+V
5 series · 5 of 5 positions shown · non-contrast
Comparison: None.

CLINICAL DATA: Status post fall

EXAM:
CERVICAL SPINE  4+ VIEWS

[lpo]
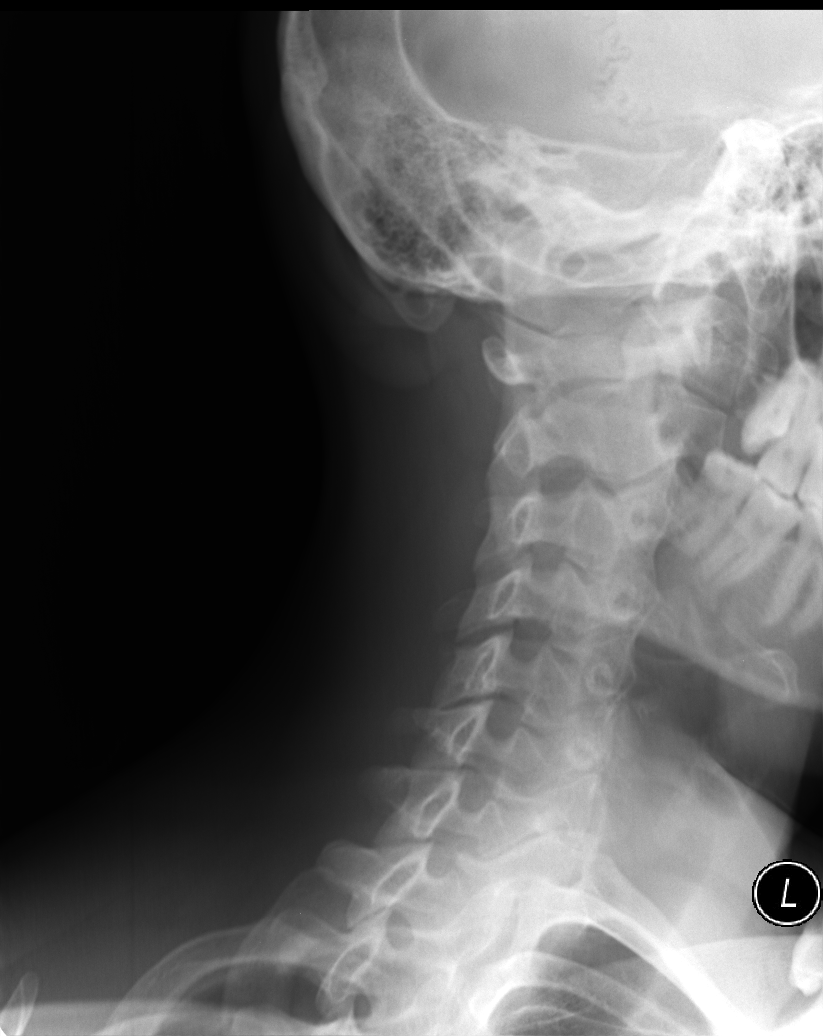

[lateral]
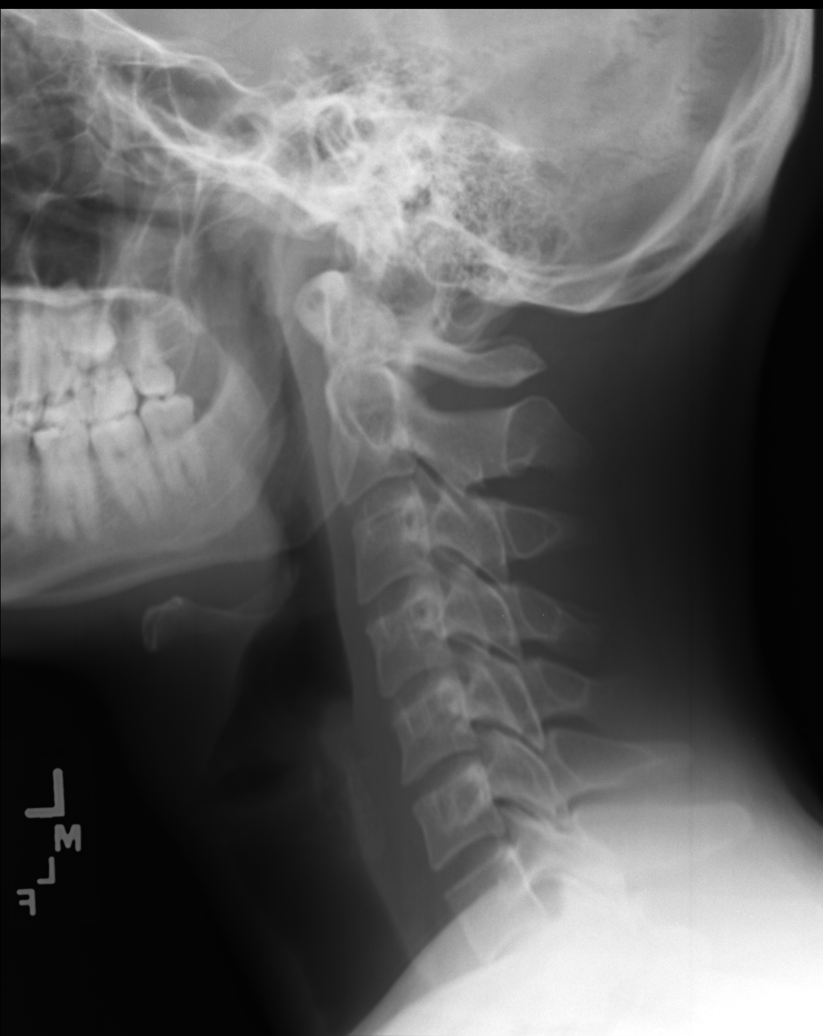

[rpo]
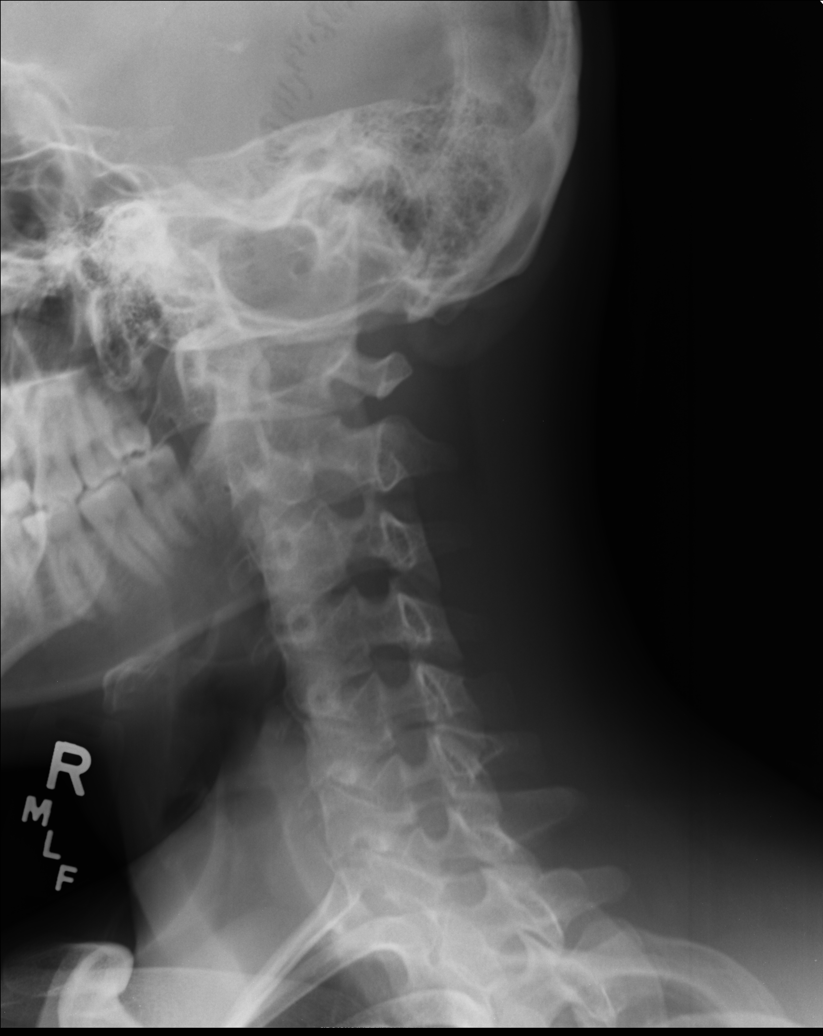

[AP]
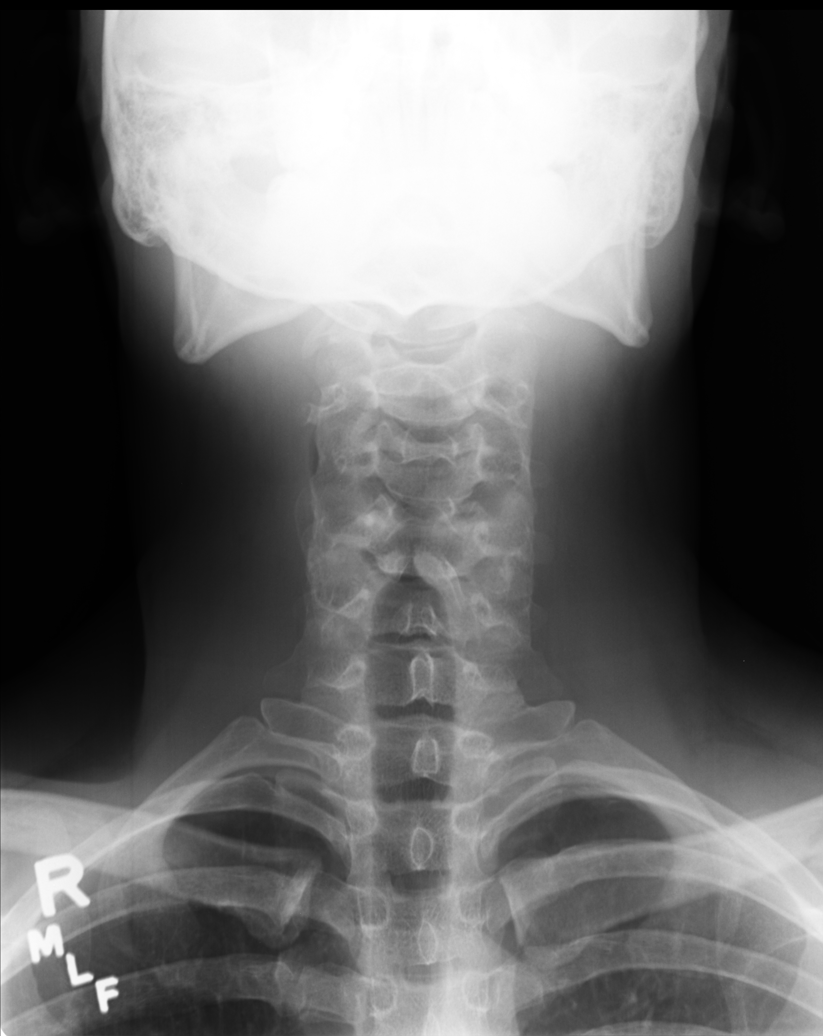

[ap open mouth]
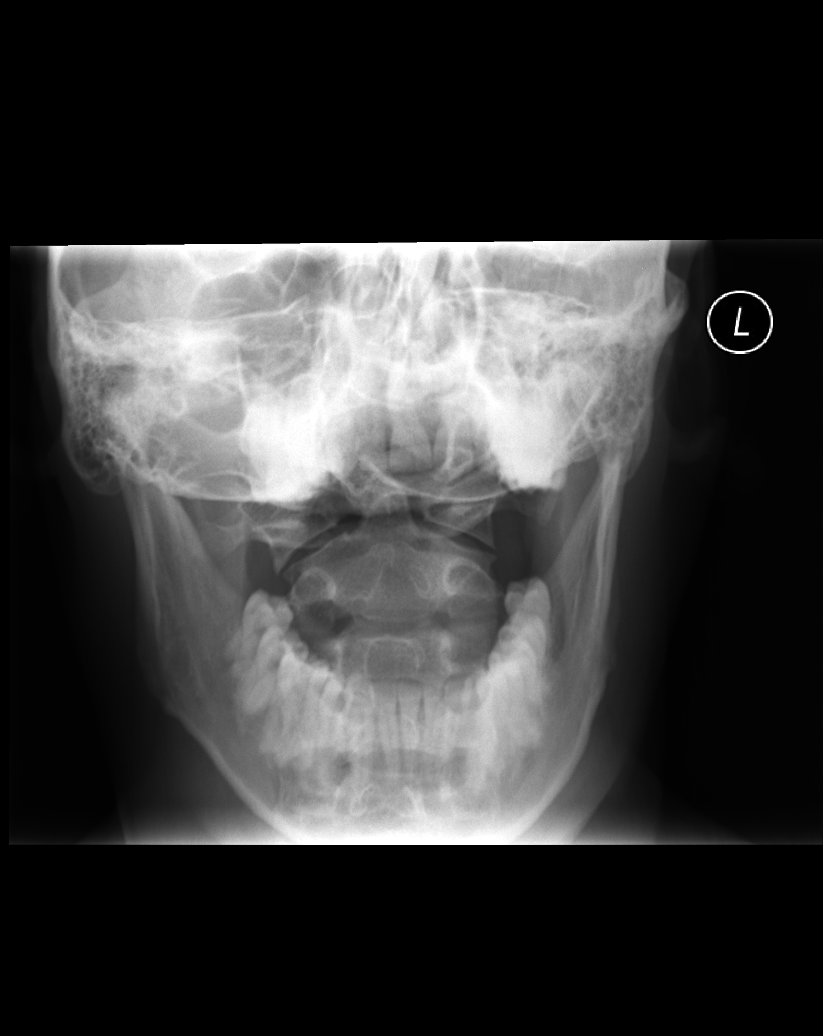

[5 of 5 positions shown; findings below may reference images not displayed]

FINDINGS: The cervical vertebral bodies are preserved in height. The
intervertebral disc space heights and the prevertebral soft tissues
are normal. The oblique views reveal no bony encroachment upon the
neural foramina. The odontoid is intact. The observed portions of
the first and second ribs are normal. A small amount of gas noted on
the frontal film to the right of midline is consistent with the
normal distended hypopharynx.
IMPRESSION: There is no acute cervical spine fracture nor dislocation.
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
## 2021-01-05 ENCOUNTER — Other Ambulatory Visit: Payer: Self-pay

## 2021-01-05 ENCOUNTER — Encounter (HOSPITAL_COMMUNITY): Payer: Self-pay

## 2021-01-05 ENCOUNTER — Emergency Department (HOSPITAL_COMMUNITY)
Admission: EM | Admit: 2021-01-05 | Discharge: 2021-01-05 | Disposition: A | Discharge status: Home / Self Care | Payer: No Typology Code available for payment source | Attending: Emergency Medicine | Admitting: Emergency Medicine

## 2021-01-05 DIAGNOSIS — Y990 Civilian activity done for income or pay: Secondary | ICD-10-CM | POA: Diagnosis not present

## 2021-01-05 DIAGNOSIS — S0990XA Unspecified injury of head, initial encounter: Secondary | ICD-10-CM | POA: Diagnosis present

## 2021-01-05 DIAGNOSIS — S0101XA Laceration without foreign body of scalp, initial encounter: Secondary | ICD-10-CM | POA: Insufficient documentation

## 2021-01-05 DIAGNOSIS — Z23 Encounter for immunization: Secondary | ICD-10-CM | POA: Insufficient documentation

## 2021-01-05 DIAGNOSIS — Z87891 Personal history of nicotine dependence: Secondary | ICD-10-CM | POA: Diagnosis not present

## 2021-01-05 DIAGNOSIS — W228XXA Striking against or struck by other objects, initial encounter: Secondary | ICD-10-CM | POA: Diagnosis not present

## 2021-01-05 MED ORDER — TETANUS-DIPHTH-ACELL PERTUSSIS 5-2.5-18.5 LF-MCG/0.5 IM SUSY
0.5000 mL | PREFILLED_SYRINGE | Freq: Once | INTRAMUSCULAR | Status: AC
  Administered 2021-01-05: 0.5 mL via INTRAMUSCULAR
  Filled 2021-01-05: qty 0.5

## 2021-01-05 MED ORDER — LIDOCAINE HCL (PF) 1 % IJ SOLN
5.0000 mL | Freq: Once | INTRAMUSCULAR | Status: AC
Start: 2021-01-05 — End: 2021-01-05
  Administered 2021-01-05: 5 mL
  Filled 2021-01-05: qty 30

## 2021-01-05 NOTE — ED Notes (Signed)
Pt provided with discharge papers and staff answered all questions. Unable to sign due to placement in hall bed.

## 2021-01-05 NOTE — ED Provider Notes (Signed)
[facility]-EMERGENCY DEPT [facility] Emergency Department Provider Note MRN:  [id number]  Arrival date & time: 01/05/21     Chief Complaint   Head Laceration   History of Present Illness   Timothy Whitaker is a 35 y.o. year-old male with no pertinent past medical history presenting to the ED with chief complaint of head laceration.  Patient injured himself at work.  [place]  He turned around and walked into part of the underside of the airplane.  Struck his head sustaining a laceration.  Did not pass out.  No headache, no nausea or vomiting, no numbness or weakness to the arms or legs.  No neck pain initially but over the past hour has been having some mild soreness to the muscles.  Denies any other injuries.  Pain is mild, constant, no exacerbating or alleviating factors.  Review of Systems  A complete 10 system review of systems was obtained and all systems are negative except as noted in the HPI and PMH.   Patient's Health History    Past Medical History:  Diagnosis Date  . History of ADHD     Past Surgical History:  Procedure Laterality Date  . EYE SURGERY     age 61  . ORIF CLAVICULAR FRACTURE Left 02/05/2014   Procedure: OPEN REDUCTION INTERNAL FIXATION (ORIF) CLAVICULAR FRACTURE;  Surgeon: Harvie Junior, MD;  Location:  SUR[facility]ERY [facility];  Service: Orthopedics;  Laterality: Left;    History reviewed. No pertinent family history.  Social History   Socioeconomic History  . Marital status: Single    Spouse name: Not on file  . Number of children: Not on file  . Years of education: Not on file  . Highest education level: Not on file  Occupational History  . Not on file  Tobacco Use  . Smoking status: Former Smoker    Types: Cigarettes    Quit date: 01/14/2014    Years since quitting: 6.9  . Smokeless tobacco: Not on file  Substance and Sexual Activity  . Alcohol use: Yes    Comment: Drinks weekends only  . Drug use: Yes    Types:  Marijuana    Comment: Smoked 1 month ago  . Sexual activity: Never  Other Topics Concern  . Not on file  Social History Narrative  . Not on file   Social Determinants of Health   Financial Resource Strain: Not on file  Food Insecurity: Not on file  Transportation Needs: Not on file  Physical Activity: Not on file  Stress: Not on file  Social Connections: Not on file  Intimate Partner Violence: Not on file     Physical Exam   Vitals:   01/05/21 0226  BP: (!) 149/93  Pulse: (!) 106  Resp: 16  Temp: 97.9 F (36.6 C)  SpO2: 100%    CONSTITUTIONAL: Well-appearing, NAD NEURO:  Alert and oriented x 3, no focal deficits EYES:  eyes equal and reactive ENT/NECK:  no LAD, no JVD CARDIO: Regular rate, well-perfused, normal S1 and S2 PULM:  CTAB no wheezing or rhonchi GI/GU:  normal bowel sounds, non-distended, non-tender MSK/SPINE:  No gross deformities, no edema SKIN: 3 cm laceration to the right frontal scalp PSYCH:  Appropriate speech and behavior  *Additional and/or pertinent findings included in MDM below  Diagnostic and Interventional Summary    EKG Interpretation  Date/Time:    Ventricular Rate:    PR Interval:    QRS Duration:  QT Interval:    QTC Calculation:   R Axis:     Text Interpretation:        Labs Reviewed - No data to display  No orders to display    Medications  Tdap (BOOSTRIX) injection 0.5 mL (has no administration in time range)  lidocaine (PF) (XYLOCAINE) 1 % injection 5 mL (5 mLs Infiltration Given 01/05/21 0251)     Procedures  /  Critical Care .Marland KitchenLaceration Repair  Date/Time: 01/05/2021 3:20 AM Performed by: Sabas Sous, MD Authorized by: Sabas Sous, MD   Consent:    Consent obtained:  Verbal   Consent given by:  Patient   Risks, benefits, and alternatives were discussed: yes     Risks discussed:  Infection, need for additional repair, nerve damage, poor cosmetic result, pain, poor wound healing, vascular damage, tendon  damage and retained foreign body   Alternatives discussed:  No treatment Universal protocol:    Procedure explained and questions answered to patient or proxy's satisfaction: yes     Immediately prior to procedure, a time out was called: yes     Patient identity confirmed:  Verbally with patient Anesthesia:    Anesthesia method:  Local infiltration   Local anesthetic:  Lidocaine 1% w/o epi Laceration details:    Location:  Scalp   Scalp location:  Frontal   Length (cm):  3   Depth (mm):  2 Pre-procedure details:    Preparation:  Patient was prepped and draped in usual sterile fashion Exploration:    Hemostasis achieved with:  Direct pressure   Wound exploration: wound explored through full range of motion and entire depth of wound visualized     Contaminated: no   Skin repair:    Repair method:  Staples   Number of staples:  2 Approximation:    Approximation:  Close Repair type:    Repair type:  Simple Post-procedure details:    Dressing:  Open (no dressing)   Procedure completion:  Tolerated well, no immediate complications    ED Course and Medical Decision Making  I have reviewed the triage vital signs, the nursing notes, and pertinent available records from the EMR.  Listed above are laboratory and imaging tests that I personally ordered, reviewed, and interpreted and then considered in my medical decision making (see below for details).  Scalp laceration, overall a well-appearing exam, no neurological symptoms or deficits, no indication for CT imaging.  Repaired as described above, tetanus updated, appropriate for discharge.       Elmer Sow. Pilar Plate, MD [facility] Emergency Medicine [facility] [email or web address]  Final Clinical Impressions(s) / ED Diagnoses     ICD-10-CM   1. Laceration of scalp, initial encounter  [email or web address]     ED Discharge Orders    None       Discharge Instructions Discussed with and Provided to Patient:      Discharge Instructions     You were evaluated in the Emergency Department and after careful evaluation, we did not find any emergent condition requiring admission or further testing in the hospital.  Your exam/testing today was overall reassuring.  We placed 2 staples in your scalp.  These will need to be removed by healthcare professional in 10 to 14 days.  We also updated your tetanus.  Please return to the Emergency Department if you experience any worsening of your condition.  Thank you for allowing [place] to be a part of your care.  Sabas Sous, MD 01/05/21 [phone]
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
## 2021-01-05 NOTE — Discharge Instructions (Addendum)
You were evaluated in the Emergency Department and after careful evaluation, we did not find any emergent condition requiring admission or further testing in the hospital.  Your exam/testing today was overall reassuring.  We placed 2 staples in your scalp.  These will need to be removed by healthcare professional in 10 to 14 days.  We also updated your tetanus.  Please return to the Emergency Department if you experience any worsening of your condition.  Thank you for allowing [place] to be a part of your care.

## 2021-01-19 ENCOUNTER — Other Ambulatory Visit: Payer: Self-pay

## 2021-01-19 ENCOUNTER — Encounter (HOSPITAL_COMMUNITY): Payer: Self-pay | Admitting: Emergency Medicine

## 2021-01-19 ENCOUNTER — Emergency Department (HOSPITAL_COMMUNITY)
Admission: EM | Admit: 2021-01-19 | Discharge: 2021-01-19 | Disposition: A | Discharge status: Home / Self Care | Payer: No Typology Code available for payment source | Attending: Emergency Medicine | Admitting: Emergency Medicine

## 2021-01-19 DIAGNOSIS — Z87891 Personal history of nicotine dependence: Secondary | ICD-10-CM | POA: Diagnosis not present

## 2021-01-19 DIAGNOSIS — S0101XD Laceration without foreign body of scalp, subsequent encounter: Secondary | ICD-10-CM | POA: Diagnosis not present

## 2021-01-19 DIAGNOSIS — Z4802 Encounter for removal of sutures: Secondary | ICD-10-CM | POA: Diagnosis not present

## 2021-01-19 DIAGNOSIS — X58XXXD Exposure to other specified factors, subsequent encounter: Secondary | ICD-10-CM | POA: Diagnosis not present

## 2021-01-19 NOTE — ED Provider Notes (Signed)
Timothy Whitaker Provider Note   CSN: [id number] Arrival date & time: 01/19/21  1409     History Chief Complaint  Patient presents with   Suture / Staple Removal    Timothy Whitaker is a 35 y.o. male.  HPI   35 y/o m presents for staple removal after having them placed on 5/26.  States he has no pain.  He has not looked at the wound but he has not noted any drainage or fevers.  Past Medical History:  Diagnosis Date   History of ADHD     There are no problems to display for this patient.   Past Surgical History:  Procedure Laterality Date   EYE SURGERY     age 43   ORIF CLAVICULAR FRACTURE Left 02/05/2014   Procedure: OPEN REDUCTION INTERNAL FIXATION (ORIF) CLAVICULAR FRACTURE;  Surgeon: Harvie Junior, MD;  Location: [facility];  Service: Orthopedics;  Laterality: Left;       History reviewed. No pertinent family history.  Social History   Tobacco Use   Smoking status: Former    Pack years: 0.00    Types: Cigarettes    Quit date: 01/14/2014    Years since quitting: 7.0  Substance Use Topics   Alcohol use: Yes    Comment: Drinks weekends only   Drug use: Yes    Types: Marijuana    Comment: Smoked 1 month ago    Home Medications Prior to Admission medications   Medication Sig Start Date End Date Taking? Authorizing Provider  HYDROmorphone (DILAUDID) 2 MG tablet Take 1-2 tablets (2-4 mg total) by mouth every 4 (four) hours as needed for severe pain. 02/05/14   [name], PA-C  Multiple Vitamin (MULTIVITAMIN) tablet Take 1 tablet by mouth daily.    [provider]  oxyCODONE-acetaminophen (PERCOCET/ROXICET) 5-325 MG per tablet Take 1-2 tablets by mouth every 6 (six) hours as needed for moderate pain. 02/05/14   [name], PA-C    Allergies    Patient has no known allergies.  Review of Systems   Review of Systems  Constitutional:  Negative for fever.  Skin:  Positive for wound.  Neurological:   Negative for headaches.   Physical Exam Updated Vital Signs BP 113/73 (BP Location: Right Arm)   Pulse 74   Temp 97.8 F (36.6 C) (Oral)   Resp 16   SpO2 99%   Physical Exam Constitutional:      General: He is not in acute distress.    Appearance: He is well-developed.  HENT:     Head:     Comments: 2 staples noted to the scalp. Wound is well healing Eyes:     Conjunctiva/sclera: Conjunctivae normal.  Cardiovascular:     Rate and Rhythm: Normal rate.  Pulmonary:     Effort: Pulmonary effort is normal.  Skin:    General: Skin is warm and dry.  Neurological:     Mental Status: He is alert and oriented to person, place, and time.    ED Results / Procedures / Treatments   Labs (all labs ordered are listed, but only abnormal results are displayed) Labs Reviewed - No data to display  EKG None  Radiology No results found.  Procedures .Suture Removal  Date/Time: 01/19/2021 3:03 PM Performed by: [name], PA-C Authorized by: [name], PA-C   Consent:    Consent obtained:  Verbal   Consent given by:  Patient   Risks, benefits, and alternatives  were discussed: yes     Risks discussed:  Pain   Alternatives discussed:  No treatment Universal protocol:    Procedure explained and questions answered to patient or proxy's satisfaction: yes     Patient identity confirmed:  Verbally with patient Location:    Location:  Head/neck   Head/neck location:  Scalp Procedure details:    Wound appearance:  No signs of infection   Number of staples removed:  2 Post-procedure details:    Post-removal:  No dressing applied   Procedure completion:  Tolerated   Medications Ordered in ED Medications - No data to display  ED Course  I have reviewed the triage vital signs and the nursing notes.  Pertinent labs & imaging results that were available during my care of the patient were reviewed by me and considered in my medical decision making (see chart for  details).    MDM Rules/Calculators/A&P                          Staple removal   Pt to ER for staple/suture removal and wound check as above. Procedure tolerated well. Vitals normal, no signs of infection. Scar minimization & return precautions given at dc.    Final Clinical Impression(s) / ED Diagnoses Final diagnoses:  Removal of staples    Rx / DC Orders ED Discharge Orders     None        [name] 01/19/21 1504    [name], MD 01/24/21 2115

## 2021-01-19 NOTE — ED Triage Notes (Signed)
Pt states that he needs the staples in his head removed that were placed there 15 days ago. Alert and oriented

## 2021-01-19 NOTE — Discharge Instructions (Addendum)
Please follow up with your primary care provider within 5-7 days for re-evaluation of your symptoms. If you do not have a primary care provider, information for a healthcare clinic has been provided for you to make arrangements for follow up care. Please return to the emergency department for any new or worsening symptoms. ° °
